# Patient Record
Sex: Female | Born: 1980 | Race: White | Hispanic: No | Marital: Married | Smoking: Never smoker
Health system: Southern US, Community
[De-identification: ages and names within clinical notes are randomized; demographics above are authoritative.]

## PROBLEM LIST (undated history)

## (undated) DIAGNOSIS — R87629 Unspecified abnormal cytological findings in specimens from vagina: Secondary | ICD-10-CM

## (undated) DIAGNOSIS — L723 Sebaceous cyst: Secondary | ICD-10-CM

## (undated) DIAGNOSIS — L732 Hidradenitis suppurativa: Secondary | ICD-10-CM

## (undated) DIAGNOSIS — R87610 Atypical squamous cells of undetermined significance on cytologic smear of cervix (ASC-US): Secondary | ICD-10-CM

## (undated) DIAGNOSIS — IMO0002 Reserved for concepts with insufficient information to code with codable children: Secondary | ICD-10-CM

## (undated) DIAGNOSIS — J302 Other seasonal allergic rhinitis: Secondary | ICD-10-CM

## (undated) DIAGNOSIS — M1990 Unspecified osteoarthritis, unspecified site: Secondary | ICD-10-CM

## (undated) DIAGNOSIS — Z8619 Personal history of other infectious and parasitic diseases: Secondary | ICD-10-CM

## (undated) DIAGNOSIS — F419 Anxiety disorder, unspecified: Secondary | ICD-10-CM

## (undated) DIAGNOSIS — E785 Hyperlipidemia, unspecified: Secondary | ICD-10-CM

## (undated) DIAGNOSIS — D649 Anemia, unspecified: Secondary | ICD-10-CM

## (undated) HISTORY — DX: Reserved for concepts with insufficient information to code with codable children: IMO0002

## (undated) HISTORY — PX: CHOLECYSTECTOMY: SHX55

## (undated) HISTORY — DX: Anemia, unspecified: D64.9

## (undated) HISTORY — DX: Other seasonal allergic rhinitis: J30.2

## (undated) HISTORY — DX: Sebaceous cyst: L72.3

## (undated) HISTORY — DX: Hyperlipidemia, unspecified: E78.5

## (undated) HISTORY — DX: Anxiety disorder, unspecified: F41.9

## (undated) HISTORY — DX: Unspecified osteoarthritis, unspecified site: M19.90

## (undated) HISTORY — DX: Hidradenitis suppurativa: L73.2

## (undated) HISTORY — DX: Personal history of other infectious and parasitic diseases: Z86.19

## (undated) HISTORY — PX: GASTRIC BYPASS: SHX52

## (undated) HISTORY — DX: Unspecified abnormal cytological findings in specimens from vagina: R87.629

---
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
## 2008-02-25 ENCOUNTER — Other Ambulatory Visit: Admission: RE | Admit: 2008-02-25 | Discharge: 2008-02-25 | Payer: Self-pay | Admitting: Obstetrics and Gynecology

## 2012-05-07 ENCOUNTER — Other Ambulatory Visit (HOSPITAL_COMMUNITY)
Admission: RE | Admit: 2012-05-07 | Discharge: 2012-05-07 | Disposition: A | Discharge status: Home / Self Care | Payer: 59 | Source: Ambulatory Visit | Attending: Obstetrics and Gynecology | Admitting: Obstetrics and Gynecology

## 2012-05-07 ENCOUNTER — Other Ambulatory Visit: Payer: Self-pay | Admitting: Adult Health

## 2012-05-07 DIAGNOSIS — Z01419 Encounter for gynecological examination (general) (routine) without abnormal findings: Secondary | ICD-10-CM | POA: Insufficient documentation

## 2012-05-07 DIAGNOSIS — Z1151 Encounter for screening for human papillomavirus (HPV): Secondary | ICD-10-CM | POA: Insufficient documentation

## 2012-05-07 DIAGNOSIS — R87810 Cervical high risk human papillomavirus (HPV) DNA test positive: Secondary | ICD-10-CM | POA: Insufficient documentation

## 2013-04-02 ENCOUNTER — Encounter: Payer: Self-pay | Admitting: Adult Health

## 2013-04-02 ENCOUNTER — Encounter: Payer: 59 | Admitting: Adult Health

## 2013-04-02 NOTE — Progress Notes (Signed)
This encounter was created in error - please disregard.

## 2013-05-13 ENCOUNTER — Encounter: Payer: Self-pay | Admitting: Adult Health

## 2013-05-13 ENCOUNTER — Other Ambulatory Visit (HOSPITAL_COMMUNITY)
Admission: RE | Admit: 2013-05-13 | Discharge: 2013-05-13 | Disposition: A | Discharge status: Home / Self Care | Payer: 59 | Source: Ambulatory Visit | Attending: Adult Health | Admitting: Adult Health

## 2013-05-13 ENCOUNTER — Ambulatory Visit (INDEPENDENT_AMBULATORY_CARE_PROVIDER_SITE_OTHER): Payer: 59 | Admitting: Adult Health

## 2013-05-13 ENCOUNTER — Encounter (INDEPENDENT_AMBULATORY_CARE_PROVIDER_SITE_OTHER): Payer: Self-pay

## 2013-05-13 VITALS — BP 110/80 | HR 74 | Ht 62.5 in | Wt 254.5 lb

## 2013-05-13 DIAGNOSIS — Z01419 Encounter for gynecological examination (general) (routine) without abnormal findings: Secondary | ICD-10-CM | POA: Insufficient documentation

## 2013-05-13 DIAGNOSIS — R05 Cough: Secondary | ICD-10-CM

## 2013-05-13 DIAGNOSIS — R87810 Cervical high risk human papillomavirus (HPV) DNA test positive: Secondary | ICD-10-CM | POA: Insufficient documentation

## 2013-05-13 DIAGNOSIS — Z113 Encounter for screening for infections with a predominantly sexual mode of transmission: Secondary | ICD-10-CM

## 2013-05-13 DIAGNOSIS — L732 Hidradenitis suppurativa: Secondary | ICD-10-CM

## 2013-05-13 DIAGNOSIS — Z1151 Encounter for screening for human papillomavirus (HPV): Secondary | ICD-10-CM | POA: Insufficient documentation

## 2013-05-13 HISTORY — DX: Hidradenitis suppurativa: L73.2

## 2013-05-13 MED ORDER — FLUCONAZOLE 150 MG PO TABS: ORAL_TABLET | ORAL | Status: DC

## 2013-05-13 MED ORDER — SULFAMETHOXAZOLE-TMP DS 800-160 MG PO TABS: 1.0000 | ORAL_TABLET | Freq: Two times a day (BID) | ORAL | Status: DC

## 2013-05-13 NOTE — Patient Instructions (Signed)
Hidradenitis Suppurativa, Sweat Gland Abscess Hidradenitis suppurativa is a long lasting (chronic), uncommon disease of the sweat glands. With this, boil-like lumps and scarring develop in the groin, some times under the arms (axillae), and under the breasts. It may also uncommonly occur behind the ears, in the crease of the buttocks, and around the genitals.  CAUSES  The cause is from a blocking of the sweat glands. They then become infected. It may cause drainage and odor. It is not contagious. So it cannot be given to someone else. It most often shows up in puberty (about 63 to 33 years of age). But it may happen much later. It is similar to acne which is a disease of the sweat glands. This condition is slightly more common in African-Americans and women. SYMPTOMS   Hidradenitis usually starts as one or more red, tender, swellings in the groin or under the arms (axilla).  Over a period of hours to days the lesions get larger. They often open to the skin surface, draining clear to yellow-colored fluid.  The infected area heals with scarring. DIAGNOSIS  Your caregiver makes this diagnosis by looking at you. Sometimes cultures (growing germs on plates in the lab) may be taken. This is to see what germ (bacterium) is causing the infection.  TREATMENT   Topical germ killing medicine applied to the skin (antibiotics) are the treatment of choice. Antibiotics taken by mouth (systemic) are sometimes needed when the condition is getting worse or is severe.  Avoid tight-fitting clothing which traps moisture in.  Dirt does not cause hidradenitis and it is not caused by poor hygiene.  Involved areas should be cleaned daily using an antibacterial soap. Some patients find that the liquid form of Lever 2000, applied to the involved areas as a lotion after bathing, can help reduce the odor related to this condition.  Sometimes surgery is needed to drain infected areas or remove scarred tissue. Removal of  large amounts of tissue is used only in severe cases.  Birth control pills may be helpful.  Oral retinoids (vitamin A derivatives) for 6 to 12 months which are effective for acne may also help this condition.  Weight loss will improve but not cure hidradenitis. It is made worse by being overweight. But the condition is not caused by being overweight.  This condition is more common in people who have had acne.  It may become worse under stress. There is no medical cure for hidradenitis. It can be controlled, but not cured. The condition usually continues for years with periods of getting worse and getting better (remission). Document Released: 01/18/2004 Document Revised: 08/28/2011 Document Reviewed: 02/03/2008 [facility] Patient Information 2014 [place], [place]. Try delsym Physical in 1 year Mammogram at 39  Take septra ds NO shower gel ,use different razors for different

## 2013-05-13 NOTE — Progress Notes (Signed)
Patient ID: Jade Cook, female   DOB: Jan 10, 1981, 32 y.o.   MRN: [id number] History of Present Illness: Jade Cook is a 32 year old white female married in for a pap and physical.She had a normal pap last year but was +HPV.She is complaining of a cough, took Z-pack last week.Also complains of a boil in vagina area.Has gained 30 lbs this year, had labs at PCP, but has not got results yet.   Current Medications, Allergies, Past Medical History, Past Surgical History, Family History and Social History were reviewed in [employer] record.  No joint pain or mood swings.   Review of Systems: Patient denies any headaches, blurred vision, shortness of breath, chest pain, abdominal pain, problems with bowel movements, urination, or intercourse.    Physical Exam:BP 110/80  Pulse 74  Ht 5' 2.5" (1.588 m)  Wt 254 lb 8 oz (115.44 kg)  BMI 45.78 kg/m2  LMP 04/18/2013 General:  Well developed, well nourished, no acute distress Skin:  Warm and dry Neck:  Midline trachea, normal thyroid Lungs; Clear to auscultation bilaterally Breast:  No dominant palpable mass, retraction, or nipple discharge Cardiovascular: Regular rate and rhythm Abdomen:  Soft, non tender, no hepatosplenomegaly Pelvic:  External genitalia is normal in appearance, except she does have some hidradenitis in groin area..  The vagina is normal in appearance, except has boil left labia and she has squeezed it and it is bruised..The cervix is smooth.Pap with HPV performed, and obtained a GC/CHL.  Uterus is felt to be normal size, shape, and contour.  No adnexal masses or tenderness noted. Extremities:  No swelling or varicosities noted Psych:  No mood changes,alert and cooperative,seems happy   Impression: Yearly gyn exam Hidradenitis Cough STD screening    Plan: Physical in 1 year Mammogram at 39 Rx septra ds #28 1 bid x 14 days with 1 refill Rx diflucan 150 mg # 2 1 now and 1 in 3 days with 1  refill Try Zeasorb powders Use different razors to shave with, do not use shower gel Try delsym Follow up GC/CHL in am

## 2013-05-14 LAB — GC/CHLAMYDIA PROBE AMP
CT Probe RNA: NEGATIVE
GC Probe RNA: NEGATIVE

## 2013-05-19 ENCOUNTER — Telehealth: Payer: Self-pay | Admitting: Adult Health

## 2013-05-19 NOTE — Telephone Encounter (Signed)
Pt informed of Negative GC/CHL from 05/13/2013. Pap still pending.

## 2013-05-21 ENCOUNTER — Telehealth: Payer: Self-pay | Admitting: Adult Health

## 2013-05-21 NOTE — Telephone Encounter (Signed)
Left message that pap had +HPV need to repeat next year and to call me with any questions

## 2013-10-03 ENCOUNTER — Other Ambulatory Visit: Payer: Self-pay | Admitting: Adult Health

## 2014-05-19 ENCOUNTER — Other Ambulatory Visit (HOSPITAL_COMMUNITY)
Admission: RE | Admit: 2014-05-19 | Discharge: 2014-05-19 | Disposition: A | Discharge status: Home / Self Care | Payer: 59 | Source: Ambulatory Visit | Attending: Adult Health | Admitting: Adult Health

## 2014-05-19 ENCOUNTER — Ambulatory Visit (INDEPENDENT_AMBULATORY_CARE_PROVIDER_SITE_OTHER): Payer: 59 | Admitting: Adult Health

## 2014-05-19 ENCOUNTER — Encounter: Payer: Self-pay | Admitting: Adult Health

## 2014-05-19 VITALS — BP 138/90 | HR 78 | Ht 63.0 in | Wt 262.5 lb

## 2014-05-19 DIAGNOSIS — Z8619 Personal history of other infectious and parasitic diseases: Secondary | ICD-10-CM

## 2014-05-19 DIAGNOSIS — R87810 Cervical high risk human papillomavirus (HPV) DNA test positive: Secondary | ICD-10-CM | POA: Diagnosis present

## 2014-05-19 DIAGNOSIS — Z1151 Encounter for screening for human papillomavirus (HPV): Secondary | ICD-10-CM | POA: Insufficient documentation

## 2014-05-19 DIAGNOSIS — Z01419 Encounter for gynecological examination (general) (routine) without abnormal findings: Secondary | ICD-10-CM | POA: Insufficient documentation

## 2014-05-19 DIAGNOSIS — IMO0002 Reserved for concepts with insufficient information to code with codable children: Secondary | ICD-10-CM | POA: Insufficient documentation

## 2014-05-19 HISTORY — DX: Personal history of other infectious and parasitic diseases: Z86.19

## 2014-05-19 HISTORY — DX: Reserved for concepts with insufficient information to code with codable children: IMO0002

## 2014-05-19 NOTE — Patient Instructions (Signed)
Physical in 1 year Mammogram at 39  Labs PCP

## 2014-05-19 NOTE — Progress Notes (Signed)
Patient ID: Jade BillingsKimberly D Cook, female   DOB: 14-Jan-1981, 33 y.o.   MRN: [id number] History of Present Illness: Jade BradfordKimberly is a 33 year old white female, married in for pap and physical.She had a normal pap but +HPV 05/13/13.Period cycle varies.She gets bumps in groin area at times.   Current Medications, Allergies, Past Medical History, Past Surgical History, Family History and Social History were reviewed in [facility] electronic medical record.     Review of Systems: Patient denies any headaches, blurred vision, shortness of breath, chest pain, abdominal pain, problems with bowel movements, urination, or intercourse. No mood swings, has pain in arms and thighs numb and right one burns at times and back ache, will see PA next week, she thinks she has PCS.    Physical Exam: General:  Well developed, well nourished, no acute distress Skin:  Warm and dry Neck:  Midline trachea, normal thyroid Lungs; Clear to auscultation bilaterally Breast:  No dominant palpable mass, retraction, or nipple discharge Cardiovascular: Regular rate and rhythm Abdomen:  Soft, non tender, no hepatosplenomegaly Pelvic:  External genitalia is normal in appearance.  The vagina is normal in appearance.  The cervix is smooth.Pap with HPV performed.  Uterus is felt to be normal size, shape, and contour.  No  adnexal masses or tenderness noted. Extremities:  No swelling or varicosities noted Psych:  No mood changes,alert and cooperative,seems happy Explained that period cycle normally varies 21-35 days.  Impression: Well woman gyn exam with pap History of +HPV on pap    Plan: Physical in 1 year Mammogram at 6540  Labs with PCP

## 2014-05-20 LAB — CYTOLOGY - PAP

## 2014-05-22 ENCOUNTER — Telehealth: Payer: Self-pay | Admitting: Adult Health

## 2014-05-22 NOTE — Telephone Encounter (Signed)
Left message to call me Monday  About pap

## 2014-05-25 ENCOUNTER — Telehealth: Payer: Self-pay | Admitting: Adult Health

## 2014-05-25 NOTE — Telephone Encounter (Signed)
Pt has spoke with JAG about results. Encounter closed. JSY

## 2014-05-25 NOTE — Telephone Encounter (Signed)
Pt aware pap was negative for malignancy but had +HPV, will repeat pap in 1 year

## 2014-12-17 ENCOUNTER — Other Ambulatory Visit: Payer: Self-pay | Admitting: Adult Health

## 2014-12-18 ENCOUNTER — Telehealth: Payer: Self-pay | Admitting: Adult Health

## 2014-12-18 NOTE — Telephone Encounter (Signed)
Spoke with pt. Pt states she has a yeast infection. Just got back from vacation. She is requesting Diflucan. I spoke with Dr. Emelda FearFerguson and he ordered Diflucan 150 mg #2 take 1 now and repeat in 3 days if needed 1 refill. Called into [facility] in [place], [place]. JSY

## 2015-05-24 ENCOUNTER — Other Ambulatory Visit: Payer: Self-pay | Admitting: Adult Health

## 2015-06-03 ENCOUNTER — Encounter: Payer: Self-pay | Admitting: Adult Health

## 2015-06-03 ENCOUNTER — Ambulatory Visit (INDEPENDENT_AMBULATORY_CARE_PROVIDER_SITE_OTHER): Payer: BLUE CROSS/BLUE SHIELD | Admitting: Adult Health

## 2015-06-03 ENCOUNTER — Other Ambulatory Visit (HOSPITAL_COMMUNITY)
Admission: RE | Admit: 2015-06-03 | Discharge: 2015-06-03 | Disposition: A | Discharge status: Home / Self Care | Payer: BLUE CROSS/BLUE SHIELD | Source: Ambulatory Visit | Attending: Adult Health | Admitting: Adult Health

## 2015-06-03 VITALS — BP 102/62 | HR 96 | Ht 63.0 in | Wt 271.0 lb

## 2015-06-03 DIAGNOSIS — Z1151 Encounter for screening for human papillomavirus (HPV): Secondary | ICD-10-CM | POA: Diagnosis not present

## 2015-06-03 DIAGNOSIS — Z8619 Personal history of other infectious and parasitic diseases: Secondary | ICD-10-CM

## 2015-06-03 DIAGNOSIS — Z01419 Encounter for gynecological examination (general) (routine) without abnormal findings: Secondary | ICD-10-CM | POA: Diagnosis not present

## 2015-06-03 NOTE — Progress Notes (Signed)
Patient ID: Jade Cook, female   DOB: 1981/03/21, 34 y.o.   MRN: [id number] History of Present Illness:  Jade BradfordKimberly is a 34 year old white female, G0P0, in for well woman gyn exam and pap, she had a normal pap with +HPV 05/19/14.  [facility]P is [facility] in [place], [place].  Current Medications, Allergies, Past Medical History, Past Surgical History, Family History and Social History were reviewed in [facility] electronic medical record.     Review of Systems: Patient denies any headaches, hearing loss, fatigue, blurred vision, shortness of breath, chest pain, abdominal pain, problems with bowel movements, urination, or intercourse. No joint pain, she is moody but that is normal, she says.    Physical Exam:BP 102/62 mmHg  Pulse 96  Ht 5\' 3"  (1.6 m)  Wt 271 lb (122.925 kg)  BMI 48.02 kg/m2  LMP 05/22/2015 General:  Well developed, well nourished, no acute distress Skin:  Warm and dry Neck:  Midline trachea, normal thyroid, good ROM, no lymphadenopathy Lungs; Clear to auscultation bilaterally Breast:  No dominant palpable mass, retraction, or nipple discharge Cardiovascular: Regular rate and rhythm Abdomen:  Soft, non tender, no hepatosplenomegaly.obese Pelvic:  External genitalia is normal in appearance, no lesions.  The vagina is normal in appearance. Urethra has no lesions or masses. The cervix is smooth, pap with HPV performed.  Uterus is felt to be normal size, shape, and contour.  No adnexal masses or tenderness noted.Bladder is non tender, no masses felt. Extremities/musculoskeletal:  No swelling or varicosities noted, no clubbing or cyanosis Psych:  No mood changes, alert and cooperative,seems happy She declines the flu shot.  Impression: Well woman gyn exam with pap History of HPV     Plan: Physical in 1 year Mammogram at 1640  Labs with PCP

## 2015-06-03 NOTE — Patient Instructions (Signed)
Physical in 1 year Mammogram at 39  Labs with PCP 

## 2015-06-07 LAB — CYTOLOGY - PAP

## 2016-05-03 ENCOUNTER — Other Ambulatory Visit: Payer: Self-pay | Admitting: Adult Health

## 2016-06-05 ENCOUNTER — Ambulatory Visit (INDEPENDENT_AMBULATORY_CARE_PROVIDER_SITE_OTHER): Payer: BLUE CROSS/BLUE SHIELD | Admitting: Adult Health

## 2016-06-05 ENCOUNTER — Encounter: Payer: Self-pay | Admitting: Adult Health

## 2016-06-05 VITALS — BP 110/60 | HR 62 | Ht 62.5 in | Wt 200.5 lb

## 2016-06-05 DIAGNOSIS — Z01419 Encounter for gynecological examination (general) (routine) without abnormal findings: Secondary | ICD-10-CM | POA: Insufficient documentation

## 2016-06-05 DIAGNOSIS — L723 Sebaceous cyst: Secondary | ICD-10-CM

## 2016-06-05 DIAGNOSIS — Z01411 Encounter for gynecological examination (general) (routine) with abnormal findings: Secondary | ICD-10-CM

## 2016-06-05 HISTORY — DX: Sebaceous cyst: L72.3

## 2016-06-05 NOTE — Patient Instructions (Addendum)
Physical in 1 year Pap 2019 Mammogram at 39  Labs with PCP

## 2016-06-05 NOTE — Progress Notes (Signed)
Patient ID: Jade Cook, female   DOB: [birth year], 35 y.o.   MRN: [id number] History of Present Illness:  Jade BradfordKimberly is a 35 year old white female, married in for well woman gyn exam, had normal pap with negative HPV 06/03/15.She had gastric [facility]/28/[facility] by Dr Artist PaisYoo, and has lost about 70 lbs.  [place]P [place] in [place].  Current Medications, Allergies, Past Medical History, Past Surgical History, Family History and Social History were reviewed in [facility] electronic medical record.     Review of Systems: Patient denies any headaches, hearing loss, fatigue, blurred vision, shortness of breath, chest pain, abdominal pain, problems with bowel movements, urination, or intercourse(hurts at times since has lost weight). No joint pain or mood swings.    Physical Exam:BP 110/60 (BP Location: Left Arm, Patient Position: Sitting, Cuff Size: Large)   Pulse 62   Ht 5' 2.5" (1.588 m)   Wt 200 lb 8 oz (90.9 kg)   LMP 05/24/2016 (Exact Date)   BMI 36.09 kg/m  General:  Well developed, well nourished, no acute distress Skin:  Warm and dry, has sebaceous cyst in head(Dr Beavers to remove tomorrow) Neck:  Midline trachea, normal thyroid, good ROM, no lymphadenopathy Lungs; Clear to auscultation bilaterally Breast:  No dominant palpable mass, retraction, or nipple discharge Cardiovascular: Regular rate and rhythm Abdomen:  Soft, non tender, no hepatosplenomegaly Pelvic:  External genitalia is normal in appearance, 1cm sebaceous cyst right labia.  The vagina is normal in appearance. Urethra has no lesions or masses. The cervix is bulbous.  Uterus is felt to be normal size, shape, and contour.  No adnexal masses or tenderness noted.Bladder is non tender, no masses felt Extremities/musculoskeletal:  No swelling or varicosities noted, no clubbing or cyanosis Psych:  No mood changes, alert and cooperative,seems happy PHQ 2 score 0.   Impression: 1. Well woman exam with routine  gynecological exam   2. Sebaceous cyst       Plan: Physical in 1 year Pap 2019 Mammogram at 39  Labs with PCP

## 2017-06-07 ENCOUNTER — Encounter: Payer: Self-pay | Admitting: Adult Health

## 2017-06-07 ENCOUNTER — Ambulatory Visit (INDEPENDENT_AMBULATORY_CARE_PROVIDER_SITE_OTHER): Payer: BLUE CROSS/BLUE SHIELD | Admitting: Adult Health

## 2017-06-07 VITALS — BP 108/62 | HR 81 | Ht 62.0 in | Wt 147.5 lb

## 2017-06-07 DIAGNOSIS — L723 Sebaceous cyst: Secondary | ICD-10-CM | POA: Diagnosis not present

## 2017-06-07 DIAGNOSIS — Z01411 Encounter for gynecological examination (general) (routine) with abnormal findings: Secondary | ICD-10-CM

## 2017-06-07 DIAGNOSIS — Z9884 Bariatric surgery status: Secondary | ICD-10-CM

## 2017-06-07 DIAGNOSIS — Z01419 Encounter for gynecological examination (general) (routine) without abnormal findings: Secondary | ICD-10-CM

## 2017-06-07 DIAGNOSIS — N920 Excessive and frequent menstruation with regular cycle: Secondary | ICD-10-CM | POA: Diagnosis not present

## 2017-06-07 NOTE — Progress Notes (Signed)
Patient ID: Jade BillingsKimberly D Cook, female   DOB: [birth year], 36 y.o.   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 Has been tired and wants to sleep.   PCP is Dr Michel SanteeFavero in [place], and she had labs at his office including iron which was normal.   Current Medications, Allergies, Past Medical History, Past Surgical History, Family History and Social History were reviewed in [facility] electronic medical record.     Review of Systems: Patient denies any headaches, hearing loss,  blurred vision, shortness of breath, chest pain, abdominal pain, problems with bowel movements, urination, or intercourse. No joint pain or mood swings. See HPI for positives.   Physical Exam:BP 108/62 (BP Location: Left Arm, Patient Position: Sitting, Cuff Size: Normal)   Pulse 81   Ht 5\' 2"  (1.575 m)   Wt 147 lb 8 oz (66.9 kg)   LMP 05/25/2017   BMI 26.98 kg/m Has lost 102 lbs since surgery, total of 123. General:  Well developed, well nourished, no acute distress Skin:  Warm and dry Neck:  Midline trachea, normal thyroid, good ROM, no lymphadenopathy Lungs; Clear to auscultation bilaterally Breast:  No dominant palpable mass, retraction, or nipple discharge Cardiovascular: Regular rate and rhythm Abdomen:  Soft, non tender, no hepatosplenomegaly Pelvic:  External genitalia is normal in appearance, no lesions.  The vagina is normal in appearance. Urethra has no lesions or masses. The cervix is smooth.  Uterus is felt to be normal size, shape, and contour.  No adnexal masses or tenderness noted.Bladder is non tender, no masses felt. Extremities/musculoskeletal:  No swelling or varicosities noted, no  clubbing or cyanosis Psych:  No mood changes, alert and cooperative,seems happy PHQ 2 score 1.has been to counseling since weight loss surgery.  Impression:  1. Well woman exam with routine gynecological exam   2. Menorrhagia with regular cycle   3. Sebaceous cyst   4. History of weight loss surgery      Plan: Return in 2 weeks for GYN US Pap and physical in 1 year Mammogram at 39

## 2017-06-07 NOTE — Patient Instructions (Signed)

## 2017-06-25 ENCOUNTER — Other Ambulatory Visit: Payer: Self-pay | Admitting: Adult Health

## 2017-06-25 DIAGNOSIS — N920 Excessive and frequent menstruation with regular cycle: Secondary | ICD-10-CM

## 2017-06-26 ENCOUNTER — Ambulatory Visit: Payer: BLUE CROSS/BLUE SHIELD

## 2017-07-05 ENCOUNTER — Ambulatory Visit (INDEPENDENT_AMBULATORY_CARE_PROVIDER_SITE_OTHER): Payer: BLUE CROSS/BLUE SHIELD

## 2017-07-05 DIAGNOSIS — N920 Excessive and frequent menstruation with regular cycle: Secondary | ICD-10-CM

## 2017-07-05 NOTE — Progress Notes (Signed)
PELVIC US TA/TV: anteverted heterogeneous uterus w/ posterior myometrial linear striation, ? Adenomyosis,EEC 7 mm,normal ovaries bilat,no free fluid,normal ovaries bilat,ovaries appear mobile,no pain during ultrasound

## 2017-07-06 ENCOUNTER — Telehealth: Payer: Self-pay | Admitting: Adult Health

## 2017-07-06 DIAGNOSIS — N920 Excessive and frequent menstruation with regular cycle: Secondary | ICD-10-CM

## 2017-07-06 MED ORDER — LEVONORGEST-ETH ESTRAD 91-DAY 0.15-0.03 &0.01 MG PO TABS: 1.0000 | ORAL_TABLET | Freq: Every day | ORAL | 4 refills | Status: DC

## 2017-07-06 NOTE — Telephone Encounter (Signed)
Pt aware US shoed adenomyosis, will rx seasonique as trial

## 2018-09-12 ENCOUNTER — Other Ambulatory Visit: Payer: BLUE CROSS/BLUE SHIELD | Admitting: Adult Health

## 2018-10-01 ENCOUNTER — Other Ambulatory Visit: Payer: Self-pay | Admitting: Adult Health

## 2018-12-23 ENCOUNTER — Encounter: Payer: Self-pay | Admitting: Adult Health

## 2018-12-23 ENCOUNTER — Ambulatory Visit (INDEPENDENT_AMBULATORY_CARE_PROVIDER_SITE_OTHER): Payer: BC Managed Care – PPO | Admitting: Adult Health

## 2018-12-23 ENCOUNTER — Other Ambulatory Visit (HOSPITAL_COMMUNITY)
Admission: RE | Admit: 2018-12-23 | Discharge: 2018-12-23 | Disposition: A | Discharge status: Home / Self Care | Payer: BC Managed Care – PPO | Source: Ambulatory Visit | Attending: Adult Health | Admitting: Adult Health

## 2018-12-23 ENCOUNTER — Other Ambulatory Visit: Payer: Self-pay

## 2018-12-23 VITALS — BP 106/63 | HR 87 | Ht 62.0 in | Wt 159.5 lb

## 2018-12-23 DIAGNOSIS — N946 Dysmenorrhea, unspecified: Secondary | ICD-10-CM

## 2018-12-23 DIAGNOSIS — Z01419 Encounter for gynecological examination (general) (routine) without abnormal findings: Secondary | ICD-10-CM

## 2018-12-23 DIAGNOSIS — N920 Excessive and frequent menstruation with regular cycle: Secondary | ICD-10-CM

## 2018-12-23 DIAGNOSIS — N941 Unspecified dyspareunia: Secondary | ICD-10-CM

## 2018-12-23 DIAGNOSIS — R6882 Decreased libido: Secondary | ICD-10-CM | POA: Insufficient documentation

## 2018-12-23 MED ORDER — ORILISSA 200 MG PO TABS: 1.0000 | ORAL_TABLET | Freq: Two times a day (BID) | ORAL | 5 refills | Status: DC

## 2018-12-23 NOTE — Progress Notes (Signed)
Patient ID: Jade Cook, female   DOB: [birth year], 38 y.o.   MRN: [id number] History of Present Illness: Jade Cook is a 38 year old white female, married, G0P0, in for a well woman gyn exam and pap.She is on OCs, but still has heavy periods, pelvic pain and pain with periods and pain with sex and decreased libido. 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   Current Medications, Allergies, Past Medical History, Past Surgical History, Family History and Social History were reviewed in [employer] record.     Review of Systems: Patient denies any headaches, hearing loss, fatigue, blurred vision, shortness of breath, chest pain, problems with bowel movements, urination. No joint pain or mood swings. See HPI for positives.   Physical Exam:BP 106/63 (BP Location: Left Arm, Patient Position: Sitting, Cuff Size: Normal)   Pulse 87   Ht 5\' 2"  (1.575 m)   Wt 159 lb 8 oz (72.3 kg)   LMP 12/18/2018 (Exact Date)   BMI 29.17 kg/m  General:  Well developed, well nourished, no acute distress Skin:  Warm and dry,tan Neck:  Midline trachea, normal thyroid, good ROM, no lymphadenopathy Lungs; Clear to auscultation bilaterally Breast:  No dominant palpable mass, retraction, or nipple discharge Cardiovascular: Regular rate and rhythm Abdomen:  Soft, non tender, no hepatosplenomegaly Pelvic:  External genitalia is normal in appearance, no lesions.  The vagina is normal in appearance.+dark blood in vault Urethra has no lesions or masses. The cervix is smooth, pap with HPV performed.  Uterus is felt to be normal size, shape, and contour, and tender.  No adnexal masses.RLQ tenderness noted.Bladder is non tender, no masses felt. Extremities/musculoskeletal:  No swelling or varicosities noted, no clubbing or cyanosis Psych:  No mood changes, alert and cooperative,seems happy PHQ 2 score is 0. Fall risk is low. Examination without chaperone with her permission.   Discussed trying Freida Busman, and  possible side effects, like hot flashes, night sweats and no period, and she would like to try.  Impression: 1. Well woman exam with routine gynecological exam   2. Dysmenorrhea   3. Dyspareunia, female   4. Menorrhagia with regular cycle   5. Decreased libido       Plan: Check CBC,CMP,TSH Will continue OCs til can get orilissa approved Meds ordered this encounter  Medications  . Elagolix Sodium (ORILISSA) 200 MG TABS    Sig: Take 1 tablet by mouth 2 (two) times daily.    Dispense:  60 tablet    Refill:  5    Order Specific Question:   Supervising Provider    Answer:   Tania Ade H [[id number]]  Handout and discount card given  Follow up in 8 weeks  Physical in 1 year Pap in 3 if normal Mammogram at 39

## 2018-12-24 LAB — COMPREHENSIVE METABOLIC PANEL
ALT: 15 IU/L (ref 0–32)
AST: 13 IU/L (ref 0–40)
Albumin/Globulin Ratio: 1.5 (ref 1.2–2.2)
Albumin: 4.1 g/dL (ref 3.8–4.8)
Alkaline Phosphatase: 51 IU/L (ref 39–117)
BUN/Creatinine Ratio: 26 — ABNORMAL HIGH (ref 9–23)
BUN: 20 mg/dL (ref 6–20)
Bilirubin Total: 0.5 mg/dL (ref 0.0–1.2)
CO2: 21 mmol/L (ref 20–29)
Calcium: 9 mg/dL (ref 8.7–10.2)
Chloride: 100 mmol/L (ref 96–106)
Creatinine, Ser: 0.77 mg/dL (ref 0.57–1.00)
GFR calc Af Amer: 113 mL/min/{1.73_m2} (ref >59)
GFR calc non Af Amer: 98 mL/min/{1.73_m2} (ref >59)
Globulin, Total: 2.8 g/dL (ref 1.5–4.5)
Glucose: 94 mg/dL (ref 65–99)
Potassium: 4.8 mmol/L (ref 3.5–5.2)
Sodium: 136 mmol/L (ref 134–144)
Total Protein: 6.9 g/dL (ref 6.0–8.5)

## 2018-12-24 LAB — CBC
Hematocrit: 28.8 % — ABNORMAL LOW (ref 34.0–46.6)
Hemoglobin: 8.6 g/dL — ABNORMAL LOW (ref 11.1–15.9)
MCH: 20.6 pg — ABNORMAL LOW (ref 26.6–33.0)
MCHC: 29.9 g/dL — ABNORMAL LOW (ref 31.5–35.7)
MCV: 69 fL — ABNORMAL LOW (ref 79–97)
Platelets: 588 10*3/uL — ABNORMAL HIGH (ref 150–450)
RBC: 4.18 x10E6/uL (ref 3.77–5.28)
RDW: 15.6 % — ABNORMAL HIGH (ref 11.7–15.4)
WBC: 9.7 10*3/uL (ref 3.4–10.8)

## 2018-12-24 LAB — TSH: TSH: 2.73 u[IU]/mL (ref 0.450–4.500)

## 2018-12-25 ENCOUNTER — Telehealth: Payer: Self-pay | Admitting: Adult Health

## 2018-12-25 NOTE — Telephone Encounter (Signed)
Left message about labs, needs MV with fe, call me if wants to discuss further

## 2018-12-26 ENCOUNTER — Encounter: Payer: Self-pay | Admitting: Adult Health

## 2018-12-26 ENCOUNTER — Telehealth: Payer: Self-pay | Admitting: Adult Health

## 2018-12-26 DIAGNOSIS — R87610 Atypical squamous cells of undetermined significance on cytologic smear of cervix (ASC-US): Secondary | ICD-10-CM

## 2018-12-26 HISTORY — DX: Atypical squamous cells of undetermined significance on cytologic smear of cervix (ASC-US): R87.610

## 2018-12-26 LAB — CYTOLOGY - PAP
Diagnosis: UNDETERMINED — AB
HPV: NOT DETECTED

## 2018-12-26 NOTE — Telephone Encounter (Signed)
Pt aware pap is ASCUS with negative HPV, will just repeat in 1 year

## 2019-02-17 ENCOUNTER — Ambulatory Visit: Payer: BC Managed Care – PPO | Admitting: Adult Health

## 2019-02-17 ENCOUNTER — Ambulatory Visit (INDEPENDENT_AMBULATORY_CARE_PROVIDER_SITE_OTHER): Payer: BC Managed Care – PPO | Admitting: Women's Health

## 2019-02-17 ENCOUNTER — Encounter: Payer: Self-pay | Admitting: Women's Health

## 2019-02-17 ENCOUNTER — Other Ambulatory Visit: Payer: Self-pay

## 2019-02-17 VITALS — BP 108/71 | HR 72 | Ht 62.0 in | Wt 156.0 lb

## 2019-02-17 DIAGNOSIS — N941 Unspecified dyspareunia: Secondary | ICD-10-CM

## 2019-02-17 DIAGNOSIS — N920 Excessive and frequent menstruation with regular cycle: Secondary | ICD-10-CM

## 2019-02-17 DIAGNOSIS — D649 Anemia, unspecified: Secondary | ICD-10-CM

## 2019-02-17 DIAGNOSIS — N946 Dysmenorrhea, unspecified: Secondary | ICD-10-CM | POA: Diagnosis not present

## 2019-02-17 NOTE — Patient Instructions (Signed)
Use lubrication on you and him

## 2019-02-17 NOTE — Progress Notes (Signed)
   GYN VISIT Patient name: Jade Cook MRN [id number]  Date of birth: 02/15/1981 Chief Complaint:   Follow-up Jade Cook)  History of Present Illness:   Jade Cook is a 38 y.o. G0P0000 Caucasian female being seen today for f/u on Orilissa rx'd on 12/23/18 for heavy painful periods on COCs and pain w/ sex.  Pain and heaviness w/ periods has completely resolved. Lots of night sweats. Hgb was 8.8 on 7/6, taking fe daily. Still having pain w/ sex. Uses lubrication on him, but not on herself.   Patient's last menstrual period was 12/20/2018. Last pap 12/23/18. Results were:  ASCUS w/ neg HRHPV Review of Systems:   Pertinent items are noted in HPI Denies fever/chills, dizziness, headaches, visual disturbances, fatigue, shortness of breath, chest pain, abdominal pain, vomiting, abnormal vaginal discharge/itching/odor/irritation, problems with periods, bowel movements, urination, or intercourse unless otherwise stated above.  Pertinent History Reviewed:  Reviewed past medical,surgical, social, obstetrical and family history.  Reviewed problem list, medications and allergies. Physical Assessment:   Vitals:   02/17/19 1034  BP: 108/71  Pulse: 72  Weight: 156 lb (70.8 kg)  Height: 5\' 2"  (1.575 m)  Body mass index is 28.53 kg/m.       Physical Examination:   General appearance: alert, well appearing, and in no distress  Mental status: alert, oriented to person, place, and time  Skin: warm & dry   Cardiovascular: normal heart rate noted  Respiratory: normal respiratory effort, no distress  Abdomen: soft, non-tender   Pelvic: examination not indicated  Extremities: no edema   No results found for this or any previous visit (from the past 24 hour(s)).  Assessment & Plan:  1) Menorrhagia w/ dysmenorrhea> resolved on Orilissa 200mg  BID, understands can only take x 7mths and then will need break  2) Dyspareunia> not improved w/ Jade Cook, to try lubrication on him and herself  3)  Anemia> continue fe daily, check CBC today  Meds: No orders of the defined types were placed in this encounter.   Orders Placed This Encounter  Procedures  . CBC    Return in about 3 months (around 05/19/2019) for F/U w/ JAG.  Jade Cook CNM, [facility] 02/17/2019 11:11 AM

## 2019-02-18 LAB — CBC
Hematocrit: 34.2 % (ref 34.0–46.6)
Hemoglobin: 10.2 g/dL — ABNORMAL LOW (ref 11.1–15.9)
MCH: 21.7 pg — ABNORMAL LOW (ref 26.6–33.0)
MCHC: 29.8 g/dL — ABNORMAL LOW (ref 31.5–35.7)
MCV: 73 fL — ABNORMAL LOW (ref 79–97)
Platelets: 474 10*3/uL — ABNORMAL HIGH (ref 150–450)
RBC: 4.69 x10E6/uL (ref 3.77–5.28)
RDW: 20.4 % — ABNORMAL HIGH (ref 11.7–15.4)
WBC: 6.7 10*3/uL (ref 3.4–10.8)

## 2019-05-19 ENCOUNTER — Ambulatory Visit (INDEPENDENT_AMBULATORY_CARE_PROVIDER_SITE_OTHER): Payer: BC Managed Care – PPO | Admitting: Adult Health

## 2019-05-19 ENCOUNTER — Other Ambulatory Visit: Payer: Self-pay

## 2019-05-19 ENCOUNTER — Encounter: Payer: Self-pay | Admitting: Adult Health

## 2019-05-19 VITALS — BP 93/60 | HR 66 | Ht 62.0 in | Wt 158.0 lb

## 2019-05-19 DIAGNOSIS — N941 Unspecified dyspareunia: Secondary | ICD-10-CM

## 2019-05-19 DIAGNOSIS — D649 Anemia, unspecified: Secondary | ICD-10-CM | POA: Insufficient documentation

## 2019-05-19 DIAGNOSIS — N946 Dysmenorrhea, unspecified: Secondary | ICD-10-CM

## 2019-05-19 LAB — POCT HEMOGLOBIN: Hemoglobin: 10.8 g/dL — AB (ref 11–14.6)

## 2019-05-19 NOTE — Progress Notes (Signed)
  Subjective:     Patient ID: Jade Cook, female   DOB: [birth year], 38 y.o.   MRN: [id number]  HPI Jade Cook is a 38 year old white female, G0P0, back in folow up on taking Orilissa, and has not had a period since 12/18/2018, and no pain, but still has pain with sex at times, and now having some hot flashes, night sweats and body ached. PCP is Jade Rack PA.  Review of Systems No periods since 12/18/2018 No pelvic pain +pain with sex +hot flashes +night sweats  +body aches. Denies shortness of breath and may have occasional dizziness.  Reviewed past medical,surgical, social and family history. Reviewed medications and allergies.     Objective:   Physical Exam BP 93/60 (BP Location: Left Arm, Patient Position: Sitting, Cuff Size: Normal)   Pulse 66   Ht 5\' 2"  (1.575 m)   Wt 158 lb (71.7 kg)   LMP 12/18/2018 (Exact Date)   BMI 28.90 kg/m   HGB 10.8 which is up form 8.6 in July. Skin warm and dry.  Lungs: clear to ausculation bilaterally. Cardiovascular: regular rate and rhythm. Discussed can only take Orilissa 200 mg bid for 6 months, which will be up in January, may try Orilisa 150 mg 1 daily to see if helps at all. She is aware that can get iron transfusion, as she is sp gastric bypass and may not be absorbing iron as well , esp if period starts back.     Assessment:     1. Anemia, unspecified type Continue iron  2. Dysmenorrhea Continue Orilissa 200 mg 1 bid for now  3. Dyspareunia, female Continue Orilissa 200 mg 1 bid for now    Plan:     Follow  Up with me 07/02/2019 May switch to Orilissa 150 mg 1 daily then

## 2019-06-05 ENCOUNTER — Other Ambulatory Visit: Payer: Self-pay | Admitting: Adult Health

## 2019-07-01 ENCOUNTER — Encounter: Payer: Self-pay | Admitting: Adult Health

## 2019-07-01 ENCOUNTER — Ambulatory Visit (INDEPENDENT_AMBULATORY_CARE_PROVIDER_SITE_OTHER): Payer: BC Managed Care – PPO | Admitting: Adult Health

## 2019-07-01 ENCOUNTER — Other Ambulatory Visit: Payer: Self-pay

## 2019-07-01 VITALS — BP 114/72 | HR 73 | Ht 62.0 in | Wt 157.0 lb

## 2019-07-01 DIAGNOSIS — N941 Unspecified dyspareunia: Secondary | ICD-10-CM | POA: Diagnosis not present

## 2019-07-01 DIAGNOSIS — N946 Dysmenorrhea, unspecified: Secondary | ICD-10-CM

## 2019-07-01 DIAGNOSIS — E611 Iron deficiency: Secondary | ICD-10-CM | POA: Diagnosis not present

## 2019-07-01 LAB — POCT HEMOGLOBIN: Hemoglobin: 11.5 g/dL (ref 11–14.6)

## 2019-07-01 MED ORDER — ORILISSA 150 MG PO TABS: 1.0000 | ORAL_TABLET | Freq: Every day | ORAL | 6 refills | Status: DC

## 2019-07-01 NOTE — Progress Notes (Addendum)
  Subjective:     Patient ID: Jade Cook, female   DOB: 06-10-1981, 40 y.o.   MRN: [id number]  HPI Jade Cook is a 39 year old white female, G0P0, back in follow up on taking Orilissa 200 mg 1 bid and is doing good, but has hot flashes and night sweats.No period since July 2020, which is good she says. PCP is Sherren Mocha PA.  Review of Systems +hot flashes +night sweats No pelvic pain Some pain with sex but better No period since 12/2018, which she says is good  Reviewed past medical,surgical, social and family history. Reviewed medications and allergies.     Objective:   Physical Exam BP 114/72 (BP Location: Left Arm, Patient Position: Sitting, Cuff Size: Normal)   Pulse 73   Ht 5\' 2"  (1.575 m)   Wt 157 lb (71.2 kg)   BMI 28.72 kg/m   HGB 11.5, which is better was 10.8 05/19/19 and 8.6 in July 2020. Will finish current Orilissa and begin 150 mg daily and she how she does. 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 Talk only Face time 10 minutes.     Assessment:     .1. Low iron Continue iron  2. Dysmenorrhea Finish current orilissa 200 mg and then will start 150 mg 1 daily   3. Dyspareunia, female Meds ordered this encounter  Medications  . Elagolix Sodium (ORILISSA) 150 MG TABS    Sig: Take 1 capsule by mouth daily.    Dispense:  30 tablet    Refill:  6    Order Specific Question:   Supervising Provider    Answer:   [place] [[id number]]      Plan:     Return in 6 months for pap and physical and ROS

## 2019-10-11 ENCOUNTER — Other Ambulatory Visit: Payer: Self-pay | Admitting: Adult Health

## 2019-12-29 ENCOUNTER — Other Ambulatory Visit: Payer: BC Managed Care – PPO | Admitting: Adult Health

## 2019-12-31 ENCOUNTER — Ambulatory Visit (INDEPENDENT_AMBULATORY_CARE_PROVIDER_SITE_OTHER): Payer: BC Managed Care – PPO | Admitting: Adult Health

## 2019-12-31 ENCOUNTER — Encounter: Payer: Self-pay | Admitting: Adult Health

## 2019-12-31 ENCOUNTER — Other Ambulatory Visit (HOSPITAL_COMMUNITY)
Admission: RE | Admit: 2019-12-31 | Discharge: 2019-12-31 | Disposition: A | Discharge status: Home / Self Care | Payer: BC Managed Care – PPO | Source: Ambulatory Visit | Attending: Adult Health | Admitting: Adult Health

## 2019-12-31 VITALS — BP 111/75 | HR 53 | Ht 62.5 in | Wt 158.0 lb

## 2019-12-31 DIAGNOSIS — Z01419 Encounter for gynecological examination (general) (routine) without abnormal findings: Secondary | ICD-10-CM | POA: Diagnosis not present

## 2019-12-31 DIAGNOSIS — R6882 Decreased libido: Secondary | ICD-10-CM | POA: Diagnosis not present

## 2019-12-31 DIAGNOSIS — R87610 Atypical squamous cells of undetermined significance on cytologic smear of cervix (ASC-US): Secondary | ICD-10-CM | POA: Diagnosis not present

## 2019-12-31 DIAGNOSIS — N941 Unspecified dyspareunia: Secondary | ICD-10-CM

## 2019-12-31 MED ORDER — PREMARIN 0.625 MG/GM VA CREA
TOPICAL_CREAM | VAGINAL | 12 refills | Status: DC
Start: 2019-12-31 — End: 2020-12-23

## 2019-12-31 MED ORDER — ORILISSA 150 MG PO TABS: 1.0000 | ORAL_TABLET | Freq: Every day | ORAL | 6 refills | Status: DC

## 2019-12-31 NOTE — Progress Notes (Signed)
Patient ID: Jade Cook, female   DOB: [birth year], 39 y.o.   MRN: [id number] History of Present Illness: Jade Cook is a 39 year old white female,married, G0P0, in for a well woman gyn exam and pap. She is on 150 mg of orlissa daily, no period since last July til end of June this year, no pelvic pain, but still has pain with sex,esp near introitus now.  She works at [employer], academic.  PCP is Sherren Mocha PA.   Current Medications, Allergies, Past Medical History, Past Surgical History, Family History and Social History were reviewed in [employer] record.     Review of Systems: Patient denies any headaches, hearing loss, fatigue, blurred vision, shortness of breath, chest pain, abdominal pain, problems with bowel movements, or urination. No joint pain or mood swings. Has pain with sex. Had period end of June,first one since last July on orlissa, has no pelvic pain or pain with period now   Physical Exam:BP 111/75 (BP Location: Right Arm, Patient Position: Sitting, Cuff Size: Normal)   Pulse (!) 53   Ht 5' 2.5" (1.588 m)   Wt 158 lb (71.7 kg)   LMP 12/14/2019   BMI 28.44 kg/m  General:  Well developed, well nourished, no acute distress Skin:  Warm and dry Neck:  Midline trachea, normal thyroid, good ROM, no lymphadenopathy Lungs; Clear to auscultation bilaterally Breast:  No dominant palpable mass, retraction, or nipple discharge Cardiovascular: Regular rate and rhythm Abdomen:  Soft, non tender, no hepatosplenomegaly Pelvic:  External genitalia is normal in appearance, no lesions.  The vagina is normal in appearance. Urethra has no lesions or masses. The cervix is smooth, pap with high risk HPV 16/18 genotyping performed.  Uterus is felt to be normal size, shape, and contour.  No adnexal masses or tenderness noted.Bladder is non tender, no masses felt. Extremities/musculoskeletal:  No swelling or varicosities noted, no clubbing or cyanosis Psych:  No mood  changes, alert and cooperative,seems happy AA is 2 Fall risk is low PHQ 9 score is 6, no SI Examination chaperoned by Malachy Mood LPN  Upstream - 12/31/19 0921      Pregnancy Intention Screening   Does the patient want to become pregnant in the next year? No    Does the patient's partner want to become pregnant in the next year? No    Would the patient like to discuss contraceptive options today? No      Contraception Wrap Up   Current Method Vasectomy    End Method Vasectomy    Contraception Counseling Provided No           Impression and Plan:  1. Encounter for gynecological examination with Papanicolaou smear of cervix Pap sent Physical in 1 year Pap in 3 if normal Mammogram at 39 Labs with PCP  2. Dyspareunia, female Will try PVC Will continue Orlissa Meds ordered this encounter  Medications  . Elagolix Sodium (ORILISSA) 150 MG TABS    Sig: Take 1 capsule by mouth daily.    Dispense:  30 tablet    Refill:  6    Order Specific Question:   Supervising Provider    Answer:   Despina Hidden, LUTHER H [[id number]]  . conjugated estrogens (PREMARIN) vaginal cream    Sig: Apply with finger just inside vagina    Dispense:  24 g    Refill:  12    Order Specific Question:   Supervising Provider    Answer:   Duane Lope H [[id number]]  Follow up in 3 months for ROS 3. Decreased libido   4. ASCUS of cervix with negative high risk HPV Pap sent

## 2020-01-02 LAB — CYTOLOGY - PAP
Comment: NEGATIVE
Diagnosis: NEGATIVE
High risk HPV: NEGATIVE

## 2020-01-09 ENCOUNTER — Other Ambulatory Visit: Payer: Self-pay | Admitting: Adult Health

## 2020-04-01 ENCOUNTER — Other Ambulatory Visit: Payer: Self-pay

## 2020-04-01 ENCOUNTER — Ambulatory Visit (INDEPENDENT_AMBULATORY_CARE_PROVIDER_SITE_OTHER): Payer: BC Managed Care – PPO | Admitting: Adult Health

## 2020-04-01 ENCOUNTER — Encounter: Payer: Self-pay | Admitting: Adult Health

## 2020-04-01 VITALS — BP 100/64 | HR 57 | Ht 62.5 in | Wt 159.5 lb

## 2020-04-01 DIAGNOSIS — N941 Unspecified dyspareunia: Secondary | ICD-10-CM

## 2020-04-01 NOTE — Progress Notes (Signed)
  Subjective:     Patient ID: Jade Cook, female   DOB: July 13, 1980, 39 y.o.   MRN: [id number]  HPI Jade Cook is a 39 year old white female,married, G0P0, back in follow up on using PVC, but has used regularly, she lost her job and had COVID in September and had period then, it had been over a year, is on [place]. PCP is Sherren Mocha PA  Review of Systems Sex maybe a little better Reviewed past medical,surgical, social and family history. Reviewed medications and allergies.      Objective:   Physical Exam BP 100/64 (BP Location: Left Arm, Patient Position: Sitting, Cuff Size: Normal)   Pulse (!) 57   Ht 5' 2.5" (1.588 m)   Wt 159 lb 8 oz (72.3 kg)   BMI 28.71 kg/m  Skin warm and dry.  Lungs: clear to ausculation bilaterally. Cardiovascular: regular rate and rhythm. Pelvic exam is deferred today.    Assessment:     1. Dyspareunia, female Continue [name] Try to use PVC more often    Plan:     Follow up in July for physical, or sooner of needed

## 2020-07-29 ENCOUNTER — Other Ambulatory Visit: Payer: Self-pay | Admitting: Adult Health

## 2020-12-23 ENCOUNTER — Other Ambulatory Visit: Payer: Self-pay

## 2020-12-23 ENCOUNTER — Encounter: Payer: Self-pay | Admitting: Adult Health

## 2020-12-23 ENCOUNTER — Ambulatory Visit (INDEPENDENT_AMBULATORY_CARE_PROVIDER_SITE_OTHER): Payer: Commercial Managed Care - PPO | Admitting: Adult Health

## 2020-12-23 VITALS — BP 113/55 | HR 62 | Ht 62.0 in | Wt 161.0 lb

## 2020-12-23 DIAGNOSIS — N921 Excessive and frequent menstruation with irregular cycle: Secondary | ICD-10-CM | POA: Insufficient documentation

## 2020-12-23 DIAGNOSIS — Z1211 Encounter for screening for malignant neoplasm of colon: Secondary | ICD-10-CM

## 2020-12-23 DIAGNOSIS — Z1231 Encounter for screening mammogram for malignant neoplasm of breast: Secondary | ICD-10-CM | POA: Insufficient documentation

## 2020-12-23 DIAGNOSIS — N941 Unspecified dyspareunia: Secondary | ICD-10-CM | POA: Diagnosis not present

## 2020-12-23 DIAGNOSIS — Z01419 Encounter for gynecological examination (general) (routine) without abnormal findings: Secondary | ICD-10-CM | POA: Diagnosis not present

## 2020-12-23 LAB — HEMOCCULT GUIAC POC 1CARD (OFFICE): Fecal Occult Blood, POC: NEGATIVE

## 2020-12-23 MED ORDER — ORILISSA 150 MG PO TABS: 1.0000 | ORAL_TABLET | Freq: Every day | ORAL | 5 refills | Status: DC

## 2020-12-23 NOTE — Progress Notes (Signed)
Patient ID: Jade Cook, female   DOB: 21-Sep-1980, 40 y.o.   MRN: [id number] History of Present Illness: Jade Cook is a 40 year old white female,married, G0P0, in for a well woman gyn exam.  Lab Results  Component Value Date   DIAGPAP  12/31/2019    - Negative for intraepithelial lesion or malignancy (NILM)   HPV NOT DETECTED 12/23/2018   HPVHIGH Negative 12/31/2019   PCP is Sherren Mocha PA.   Current Medications, Allergies, Past Medical History, Past Surgical History, Family History and Social History were reviewed in [employer] record.     Review of Systems: Patient denies any headaches, hearing loss, fatigue, blurred vision, shortness of breath, chest pain, abdominal pain, problems with bowel movements, urination, or intercourse. (Has pain with sex at times, at just one spot when going in). Periods irregular and when when has, is on Orlissa.  No joint pain or mood swings.     Physical Exam:BP (!) 113/55 (BP Location: Left Arm, Patient Position: Sitting, Cuff Size: Normal)   Pulse 62   Ht 5\' 2"  (1.575 m)   Wt 161 lb (73 kg)   LMP 11/01/2020   BMI 29.45 kg/m   General:  Well developed, well nourished, no acute distress Skin:  Warm and dry,tan  Neck:  Midline trachea, normal thyroid, good ROM, no lymphadenopathy Lungs; Clear to auscultation bilaterally Breast:  No dominant palpable mass, retraction, or nipple discharge Cardiovascular: Regular rate and rhythm Abdomen:  Soft, non tender, no hepatosplenomegaly Pelvic:  External genitalia is normal in appearance, no lesions.  The vagina is normal in appearance. Urethra has no lesions or masses. The cervix is smooth.  Uterus is felt to be normal size, shape, and contour.  No adnexal masses or tenderness noted.Bladder is non tender, no masses felt. Rectal: Good sphincter tone, no polyps, or hemorrhoids felt.  Hemoccult negative. Extremities/musculoskeletal:  No swelling or varicosities noted, no clubbing  or cyanosis Psych:  No mood changes, alert and cooperative,seems happy  AA is 1 Fall risk is low Depression screen [facility] 2/9 12/23/2020 12/31/2019 12/23/2018  Decreased Interest 0 1 0  Down, Depressed, Hopeless 0 1 0  PHQ - 2 Score 0 2 0  Altered sleeping 1 2 -  Tired, decreased energy 1 1 -  Change in appetite 1 1 -  Feeling bad or failure about yourself  0 0 -  Trouble concentrating 0 0 -  Moving slowly or fidgety/restless 0 0 -  Suicidal thoughts 0 0 -  PHQ-9 Score 3 6 -  Difficult doing work/chores - Not difficult at all -    GAD 7 : Generalized Anxiety Score 12/23/2020 12/31/2019  Nervous, Anxious, on Edge 1 2  Control/stop worrying 0 1  Worry too much - different things 0 2  Trouble relaxing 0 1  Restless 0 0  Easily annoyed or irritable 1 2  Afraid - awful might happen 0 0  Total GAD 7 Score 2 8  Anxiety Difficulty - Somewhat difficult      Upstream - 12/23/20 02/23/21       Pregnancy Intention Screening   Does the patient want to become pregnant in the next year? No    Does the patient's partner want to become pregnant in the next year? No    Would the patient like to discuss contraceptive options today? No      Contraception Wrap Up   Current Method Vasectomy    End Method Vasectomy    Contraception Counseling  Provided No            Examination chaperoned by Malachy Mood LPN  Impression and Plan: 1. Encounter for well woman exam with routine gynecological exam Physical in 1 year Pap in 2024 Labs with PCP  2. Encounter for screening fecal occult blood testing   3. Screening mammogram for breast cancer Scheduled mammogram for her 01/10/21 at 6:45 pm at [facility]   4. Dyspareunia, female Will refill Orlissa for 6 months will need to stop then for a while,as will have used for 2 years, could try OCs again, will talk then  Meds ordered this encounter  Medications   Elagolix Sodium (ORILISSA) 150 MG TABS    Sig: Take 1 tablet by mouth daily.    Dispense:  28  tablet    Refill:  5    Order Specific Question:   Supervising Provider    Answer:   Despina Hidden, LUTHER H [[id number]]     5. Menorrhagia with irregular cycle

## 2021-01-10 ENCOUNTER — Other Ambulatory Visit: Payer: Self-pay

## 2021-01-10 ENCOUNTER — Ambulatory Visit (HOSPITAL_COMMUNITY)
Admission: RE | Admit: 2021-01-10 | Discharge: 2021-01-10 | Disposition: A | Discharge status: Home / Self Care | Payer: Commercial Managed Care - PPO | Source: Ambulatory Visit | Attending: Adult Health | Admitting: Adult Health

## 2021-01-10 DIAGNOSIS — Z1231 Encounter for screening mammogram for malignant neoplasm of breast: Secondary | ICD-10-CM | POA: Diagnosis not present

## 2021-07-05 ENCOUNTER — Other Ambulatory Visit: Payer: Self-pay | Admitting: Adult Health

## 2021-07-05 ENCOUNTER — Telehealth: Payer: Self-pay | Admitting: Adult Health

## 2021-07-05 MED ORDER — NORETHINDRONE 0.35 MG PO TABS: 1.0000 | ORAL_TABLET | Freq: Every day | ORAL | 11 refills | Status: DC

## 2021-07-05 NOTE — Addendum Note (Signed)
Addended by: Cyril Mourning A on: 07/05/2021 11:00 AM   Modules accepted: Orders

## 2021-07-05 NOTE — Telephone Encounter (Signed)
At last visit patient was told when she took a break on the Orilissa that Victorino Dike would prescribe a birth control pill for her.

## 2021-07-05 NOTE — Telephone Encounter (Signed)
Patient called stating that she was told that Jade Cook would call her in another medication  when she has finished the other. Patient states she uses [facility] in [place] on [address]. Please contact pt

## 2021-07-05 NOTE — Telephone Encounter (Signed)
Will stop orlissa and start Micronor.

## 2022-01-06 ENCOUNTER — Encounter: Payer: Self-pay | Admitting: Adult Health

## 2022-01-06 ENCOUNTER — Ambulatory Visit (INDEPENDENT_AMBULATORY_CARE_PROVIDER_SITE_OTHER): Payer: Commercial Managed Care - PPO | Admitting: Adult Health

## 2022-01-06 VITALS — BP 114/71 | HR 68 | Ht 62.25 in | Wt 174.5 lb

## 2022-01-06 DIAGNOSIS — D500 Iron deficiency anemia secondary to blood loss (chronic): Secondary | ICD-10-CM

## 2022-01-06 DIAGNOSIS — N926 Irregular menstruation, unspecified: Secondary | ICD-10-CM

## 2022-01-06 DIAGNOSIS — R6882 Decreased libido: Secondary | ICD-10-CM

## 2022-01-06 DIAGNOSIS — Z01419 Encounter for gynecological examination (general) (routine) without abnormal findings: Secondary | ICD-10-CM

## 2022-01-06 DIAGNOSIS — R102 Pelvic and perineal pain: Secondary | ICD-10-CM | POA: Insufficient documentation

## 2022-01-06 DIAGNOSIS — R4589 Other symptoms and signs involving emotional state: Secondary | ICD-10-CM | POA: Insufficient documentation

## 2022-01-06 DIAGNOSIS — N921 Excessive and frequent menstruation with irregular cycle: Secondary | ICD-10-CM

## 2022-01-06 DIAGNOSIS — Z1211 Encounter for screening for malignant neoplasm of colon: Secondary | ICD-10-CM

## 2022-01-06 DIAGNOSIS — G479 Sleep disorder, unspecified: Secondary | ICD-10-CM

## 2022-01-06 LAB — HEMOCCULT GUIAC POC 1CARD (OFFICE): Fecal Occult Blood, POC: NEGATIVE

## 2022-01-06 NOTE — Progress Notes (Signed)
Patient ID: Jade Cook, female   DOB: 19-Jun-1981, 41 y.o.   MRN: [id number] History of Present Illness: Jade Cook is a 41 year old white female, married, in for a well woman gyn exam.  She went to see [facility] in April and had labs and was placed on cytomel, Prometrium and testosterone cream. She was having irregular periods and decreased libido and heavy periods at times, and not sleeping well.  She was anemic, all other labs normal.  HGB 9.8 in May. Has pelvic pain. Moody.  She took last Prometrium last night, it did help with sleep some.  Lab Results  Component Value Date   DIAGPAP  12/31/2019    - Negative for intraepithelial lesion or malignancy (NILM)   HPV NOT DETECTED 12/23/2018   HPVHIGH Negative 12/31/2019    PCP is C Eure PA.  Current Medications, Allergies, Past Medical History, Past Surgical History, Family History and Social History were reviewed in [employer] record.     Review of Systems: Patient denies any headaches, hearing loss, fatigue, blurred vision, shortness of breath, chest pain, abdominal pain, problems with bowel movements, urination, or intercourse. No joint pain or mood swings.   See HPI for positives.   Physical Exam:BP 114/71 (BP Location: Left Arm, Patient Position: Sitting, Cuff Size: Normal)   Pulse 68   Ht 5' 2.25" (1.581 m)   Wt 174 lb 8 oz (79.2 kg)   LMP 12/24/2021   BMI 31.66 kg/m   General:  Well developed, well nourished, no acute distress Skin:  Warm and dry,tan Neck:  Midline trachea, normal thyroid, good ROM, no lymphadenopathy Lungs; Clear to auscultation bilaterally Breast:  No dominant palpable mass, retraction, or nipple discharge Cardiovascular: Regular rate and rhythm Abdomen:  Soft, non tender, no hepatosplenomegaly Pelvic:  External genitalia is normal in appearance, no lesions.  The vagina is normal in appearance. Urethra has no lesions or masses. The cervix is bulbous.  Uterus is felt to be normal  size, shape, and contour.  No adnexal masses or tenderness noted.Bladder is non tender, no masses felt. Rectal: Good sphincter tone, no polyps, or hemorrhoids felt.  Hemoccult negative. Extremities/musculoskeletal:  No swelling or varicosities noted, no clubbing or cyanosis Psych:  No mood changes, alert and cooperative,seems happy AA is 2 Fall risk is low    01/06/2022    9:43 AM 12/23/2020    8:54 AM 12/31/2019    9:12 AM  Depression screen PHQ 2/9  Decreased Interest 1 0 1  Down, Depressed, Hopeless 0 0 1  PHQ - 2 Score 1 0 2  Altered sleeping 1 1 2   Tired, decreased energy 2 1 1   Change in appetite 1 1 1   Feeling bad or failure about yourself  0 0 0  Trouble concentrating 0 0 0  Moving slowly or fidgety/restless 1 0 0  Suicidal thoughts 0 0 0  PHQ-9 Score 6 3 6   Difficult doing work/chores   Not difficult at all       01/06/2022    9:43 AM 12/23/2020    8:55 AM 12/31/2019    9:12 AM  GAD 7 : Generalized Anxiety Score  Nervous, Anxious, on Edge 1 1 2   Control/stop worrying 0 0 1  Worry too much - different things 1 0 2  Trouble relaxing 0 0 1  Restless 1 0 0  Easily annoyed or irritable 1 1 2   Afraid - awful might happen 0 0 0  Total GAD 7  Score 4 2 8   Anxiety Difficulty   Somewhat difficult      Upstream - 01/06/22 0950       Pregnancy Intention Screening   Does the patient want to become pregnant in the next year? No    Does the patient's partner want to become pregnant in the next year? No    Would the patient like to discuss contraceptive options today? No      Contraception Wrap Up   Current Method Vasectomy    End Method Vasectomy            Examination chaperoned by 01/08/22 LPN  Impression and Plan: 1. Encounter for well woman exam with routine gynecological exam Pap and physical in 1 year Had mammogram 01/10/21  2. Encounter for screening fecal occult blood testing Hemoccult was negative  - POCT occult blood stool  3. Pelvic pain Will get 01/12/21  01/20/22 at [facility] at 8:30 am to assess uterus and ovaries  - [facility] PELVIC COMPLETE WITH TRANSVAGINAL; Future  4. Irregular periods  5. Moody  6. Decreased libido Has testosterone cream  7. Sleep disturbance  8. Menorrhagia with irregular cycle If [place] normal, may try low dose COCs   9. Iron deficiency anemia due to chronic blood loss Continue iron

## 2022-01-20 ENCOUNTER — Ambulatory Visit (HOSPITAL_COMMUNITY)
Admission: RE | Admit: 2022-01-20 | Discharge: 2022-01-20 | Disposition: A | Discharge status: Home / Self Care | Payer: Commercial Managed Care - PPO | Source: Ambulatory Visit | Attending: Adult Health | Admitting: Adult Health

## 2022-01-20 DIAGNOSIS — R102 Pelvic and perineal pain: Secondary | ICD-10-CM | POA: Diagnosis present

## 2022-01-25 ENCOUNTER — Encounter: Payer: Self-pay | Admitting: Adult Health

## 2022-01-25 ENCOUNTER — Ambulatory Visit (INDEPENDENT_AMBULATORY_CARE_PROVIDER_SITE_OTHER): Payer: Commercial Managed Care - PPO | Admitting: Adult Health

## 2022-01-25 DIAGNOSIS — N921 Excessive and frequent menstruation with irregular cycle: Secondary | ICD-10-CM | POA: Diagnosis not present

## 2022-01-25 MED ORDER — LO LOESTRIN FE 1 MG-10 MCG / 10 MCG PO TABS: 1.0000 | ORAL_TABLET | Freq: Every day | ORAL | 11 refills | Status: DC

## 2022-01-25 NOTE — Progress Notes (Signed)
Patient ID: Jade Cook, female   DOB: [birth year], 41 y.o.   MRN: [id number]   TELEHEALTH GYNECOLOGY VISIT ENCOUNTER NOTE  [facility] location: [facility]   Patient location: Home  I connected with Jon Billings on 01/25/22 at  3:50 PM EDT by telephone and verified that I am speaking with the correct person using two identifiers. Patient was unable to do MyChart audiovisual encounter due to technical difficulties, she tried several times.    I discussed the limitations, risks, security and privacy concerns of performing an evaluation and management service by telephone and the availability of in person appointments. I also discussed with the patient that there may be a patient responsible charge related to this service. The patient expressed understanding and agreed to proceed.   History:  Jade Cook is a 41 y.o. G0P0000 female being evaluated today for having 3 periods in July, and review [place] from 01/20/22.. She denies any abnormal vaginal discharge, pelvic pain or other concerns.       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   Health Maintenance:  Normal pap and negative HRHPV on 12/31/19.  Normal mammogram on 01/10/21  Review of Systems:  Pertinent items noted in HPI and remainder of comprehensive ROS otherwise negative.  Physical Exam:    General:  Alert, oriented and cooperative.   Mental Status: Normal mood and affect perceived. Normal judgment and thought content.  Physical exam deferred due to nature of the encounter LMP 01/07/2022      Upstream - 01/25/22 1639       Pregnancy Intention Screening   Does the patient want to become pregnant in the next year? No    Does the patient's partner want to become pregnant in the next year? No    Would the patient like to discuss contraceptive options today? No      Contraception Wrap Up   Current Method Vasectomy    End Method Vasectomy    Contraception Counseling Provided No             Labs and Imaging No results found for this or any previous visit (from the past 336 hour(s)). US PELVIC COMPLETE WITH TRANSVAGINAL  Result Date: 01/20/2022 CLINICAL DATA:  Pelvic pain for 2 years, LMP 01/14/2022 EXAM: TRANSABDOMINAL AND TRANSVAGINAL ULTRASOUND OF PELVIS TECHNIQUE: Both transabdominal and transvaginal ultrasound examinations of the pelvis were performed. Transabdominal technique was performed for global imaging of the pelvis including uterus, ovaries, adnexal regions, and pelvic cul-de-sac. It was necessary to proceed with endovaginal exam following the transabdominal exam to visualize the endometrium and LEFT ovary. COMPARISON:  07/05/2017 FINDINGS: Uterus Measurements: 6.8 x 5.6 x 5.8 cm = volume: 117 mL. Anteverted. Heterogeneous myometrium. Asymmetric thickening of posterior upper uterine wall, no definite discrete mass identified, favor adenomyosis. No additional masses.  Endometrium Thickness: 1 mm.  No endometrial fluid or mass Right ovary Measurements: 4.7 x 3.3 x 5.0 cm = volume: 40.6 mL. Simple cyst RIGHT ovary 4.4 x 2.9 x 3.8 cm; 3.0 x 1.8 x 1.9 cm note: This recommendation does not apply to premenarchal patients or to those with increased risk (genetic, family history, elevated tumor markers or other high-risk factors) of ovarian cancer. Reference: Radiology 2019  Nov; 293(2):359-371. Additional small follicles. No additional mass. Left ovary Measurements: 3.0 x 1.8 x 1.9 cm = volume: 5.3 mL. Normal morphology without mass Other findings No free pelvic fluid or adnexal masses. IMPRESSION: Simple cyst RIGHT ovary 4.4 cm greatest size; no follow-up imaging recommended, as above. Suspected adenomyosis of the posterior upper uterus. Otherwise negative exam. Electronically Signed   By: Ulyses Southward M.D.   On: 01/20/2022 10:07      Assessment and Plan:     1. Menorrhagia with irregular cycle She denies any history of MI,stroke,DVT,breast cancer or migraine with aura Will try low dose COCs,start with next period  Meds ordered this encounter  Medications   Norethindrone-Ethinyl Estradiol-Fe Biphas (LO LOESTRIN FE) 1 MG-10 MCG / 10 MCG tablet    Sig: Take 1 tablet by mouth daily. Take 1 daily by mouth    Dispense:  28 tablet    Refill:  11    BIN [id number], PCN [id number], GRP [id number]    Order Specific Question:   Supervising Provider    Answer:   Duane Lope H [[id number]]    Follow up in 3 months for ROS      I discussed the assessment and treatment plan with the patient. The patient was provided an opportunity to ask questions and all were answered. The patient agreed with the plan and demonstrated an understanding of the instructions.   The patient was advised to call back or seek an in-person evaluation/go to the ED if the symptoms worsen or if the condition fails to improve as anticipated.  I provided 10 minutes of non-face-to-face time during this encounter. I was in my office at [facility] during this encounter   Cyril Mourning, NP [facility] [employer], [facility]

## 2022-01-26 ENCOUNTER — Ambulatory Visit: Payer: Commercial Managed Care - PPO | Admitting: Adult Health

## 2023-07-22 IMAGING — MG MM DIGITAL SCREENING BILAT W/ TOMO AND CAD
8 series · 9 of 24 positions shown · non-contrast
Comparison: None.

CLINICAL DATA: Screening. Baseline.

EXAM:
DIGITAL SCREENING BILATERAL MAMMOGRAM WITH TOMOSYNTHESIS AND CAD
TECHNIQUE: Bilateral screening digital craniocaudal and mediolateral oblique
mammograms were obtained. Bilateral screening digital breast
tomosynthesis was performed. The images were evaluated with
computer-aided detection.

[R CC synth-2D]
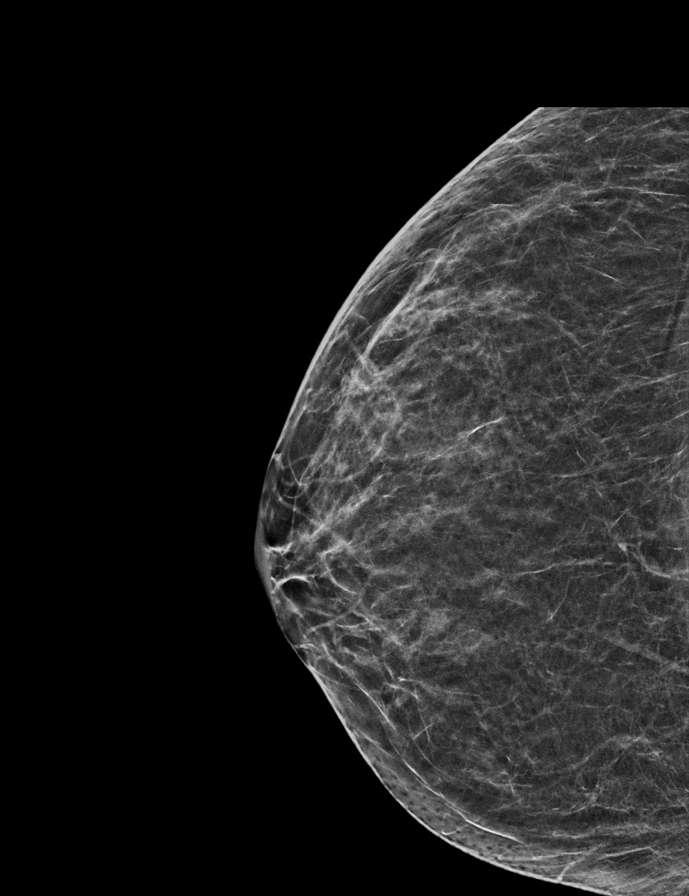

[L CC synth-2D]
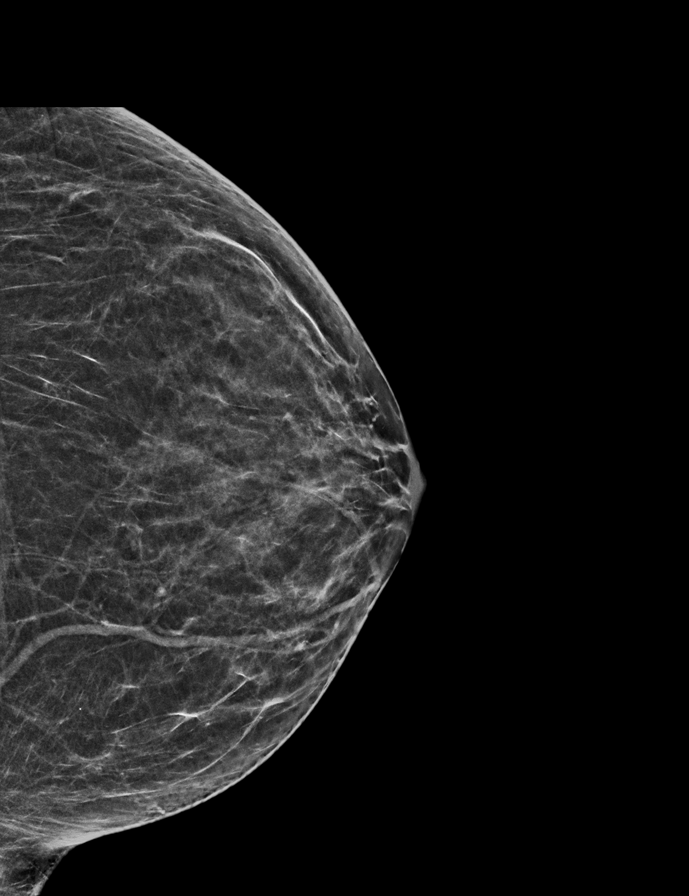

[R MLO synth-2D]
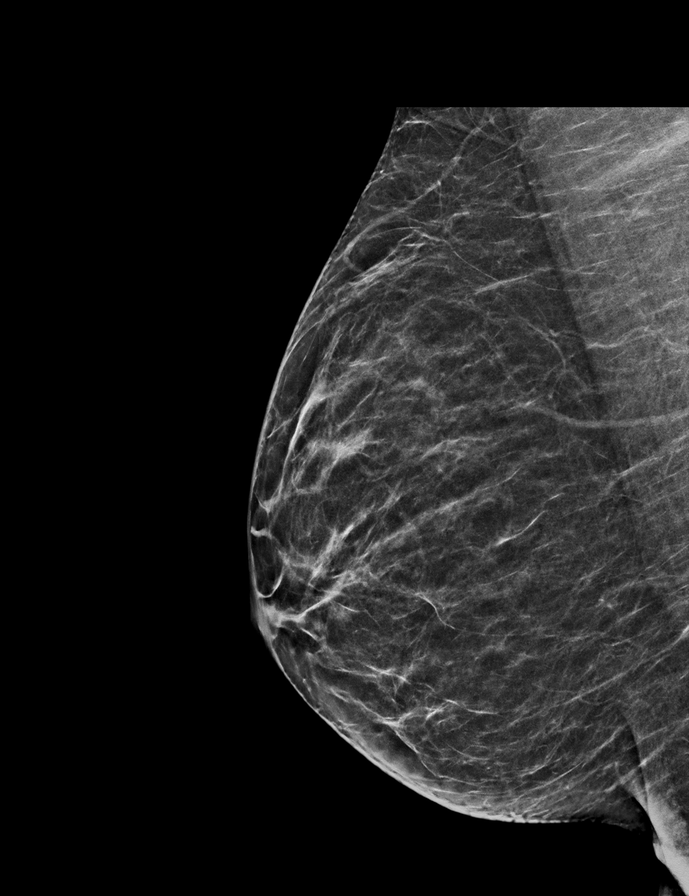

[L MLO synth-2D]
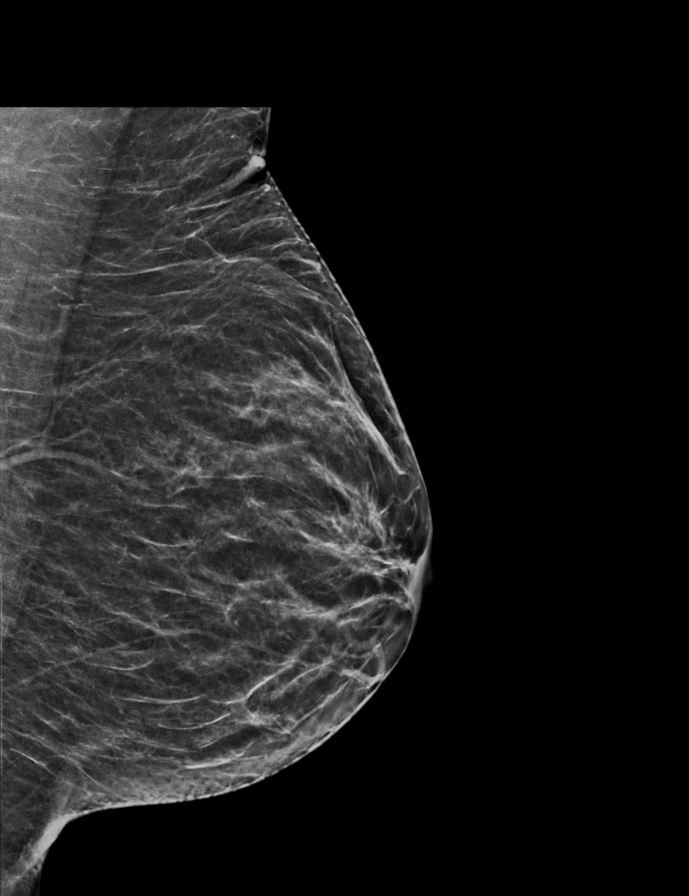

[L MLO tomo · 2 of 51 frames shown]
[frame 17/51]
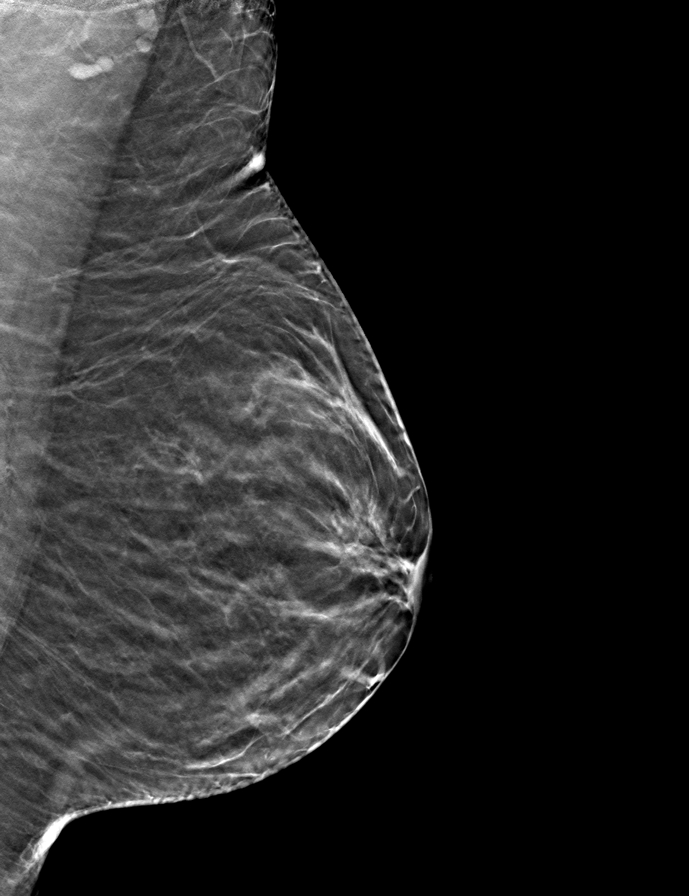
[frame 26/51]
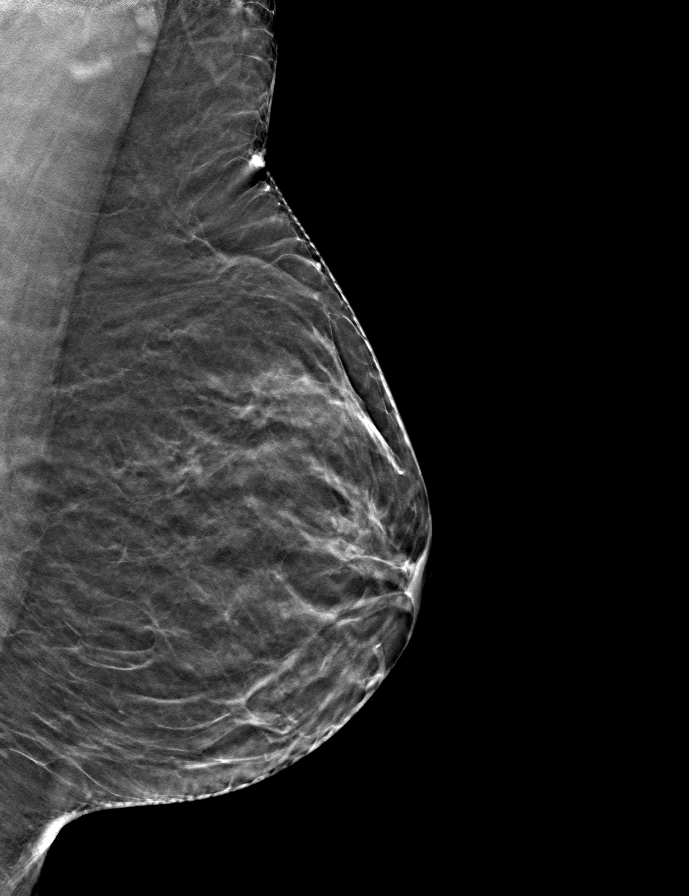

[R MLO tomo · tomo slice 27/53.0]
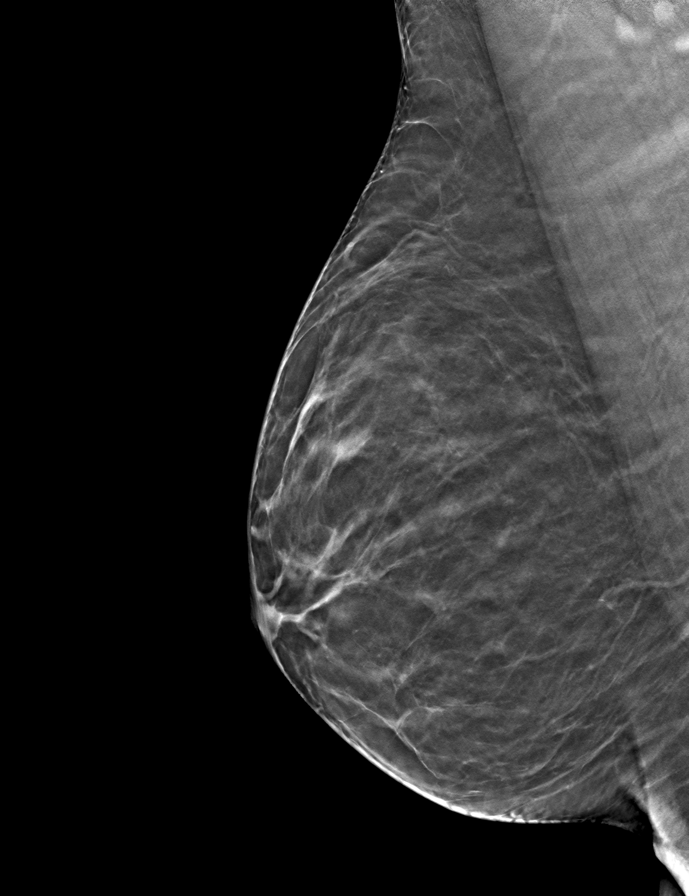

[L CC tomo · tomo slice 25/48.0]
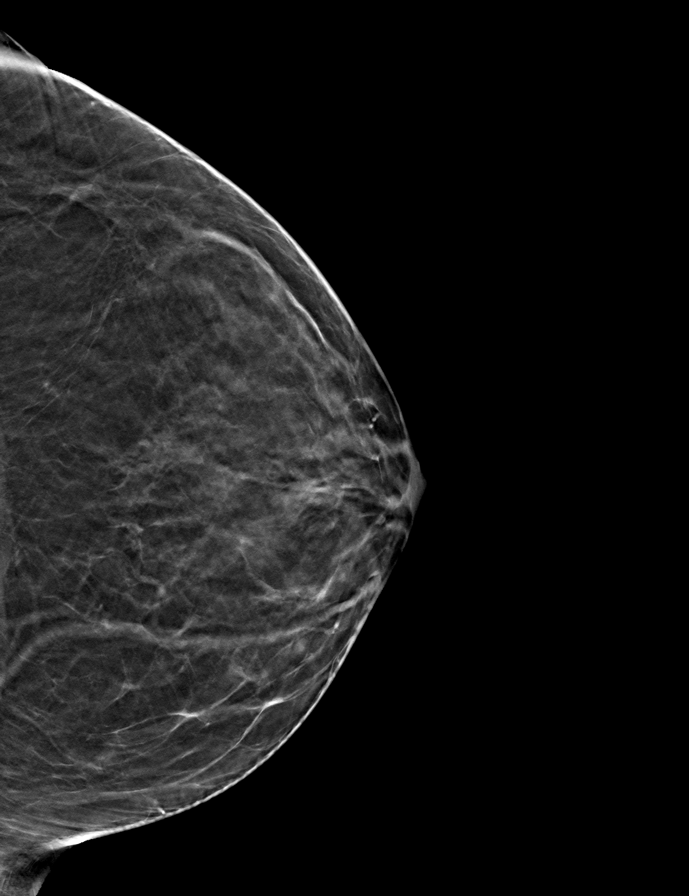

[R CC tomo · tomo slice 25/49.0]
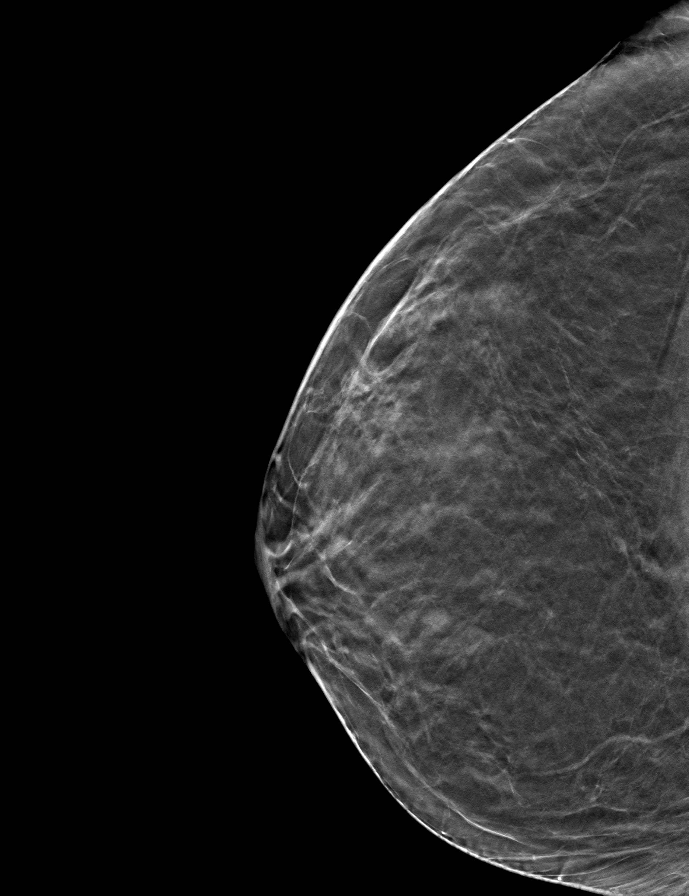

[9 of 24 positions shown; findings below may reference images not displayed]

ACR Breast Density Category b: There are scattered areas of
fibroglandular density.
FINDINGS: There are no findings suspicious for malignancy.
IMPRESSION: No mammographic evidence of malignancy. A result letter of this
screening mammogram will be mailed directly to the patient.

RECOMMENDATION:
Screening mammogram in one year. (Code:35-U-4J2)

BI-RADS CATEGORY  1: Negative.

## 2024-01-25 ENCOUNTER — Ambulatory Visit: Admitting: Adult Health

## 2024-01-25 ENCOUNTER — Encounter: Payer: Self-pay | Admitting: Adult Health

## 2024-01-25 ENCOUNTER — Other Ambulatory Visit (HOSPITAL_COMMUNITY)
Admission: RE | Admit: 2024-01-25 | Discharge: 2024-01-25 | Disposition: A | Discharge status: Home / Self Care | Source: Ambulatory Visit | Attending: Adult Health | Admitting: Adult Health

## 2024-01-25 VITALS — BP 107/69 | HR 71 | Ht 62.0 in | Wt 150.0 lb

## 2024-01-25 DIAGNOSIS — N764 Abscess of vulva: Secondary | ICD-10-CM | POA: Diagnosis not present

## 2024-01-25 DIAGNOSIS — Z01419 Encounter for gynecological examination (general) (routine) without abnormal findings: Secondary | ICD-10-CM | POA: Diagnosis present

## 2024-01-25 DIAGNOSIS — D239 Other benign neoplasm of skin, unspecified: Secondary | ICD-10-CM | POA: Diagnosis not present

## 2024-01-25 DIAGNOSIS — Z1331 Encounter for screening for depression: Secondary | ICD-10-CM | POA: Diagnosis not present

## 2024-01-25 DIAGNOSIS — Z1211 Encounter for screening for malignant neoplasm of colon: Secondary | ICD-10-CM | POA: Diagnosis not present

## 2024-01-25 DIAGNOSIS — Z01411 Encounter for gynecological examination (general) (routine) with abnormal findings: Secondary | ICD-10-CM | POA: Diagnosis not present

## 2024-01-25 LAB — HEMOCCULT GUIAC POC 1CARD (OFFICE): Fecal Occult Blood, POC: NEGATIVE

## 2024-01-25 MED ORDER — SULFAMETHOXAZOLE-TRIMETHOPRIM 800-160 MG PO TABS: 1.0000 | ORAL_TABLET | Freq: Two times a day (BID) | ORAL | 0 refills | Status: AC

## 2024-01-25 NOTE — Progress Notes (Signed)
 Patient ID: Jade Cook, female   DOB: 12/22/1980, 43 y.o.   MRN: [id number] History of Present Illness:  Jade Cook is a 43 year old white female, married, G0P0, in for a well woman gyn exam and pap. Has bump left vulva.  Has lost total of 120 lbs .  PCP is C Eure PA  Current Medications, Allergies, Past Medical History, Past Surgical History, Family History and Social History were reviewed in [employer] record.     Review of Systems: Patient denies any headaches, hearing loss, fatigue, blurred vision, shortness of breath, chest pain, abdominal pain, problems with bowel movements, urination, or intercourse.(Not currently active,husband has had 4 surgeries) No joint pain or mood swings.  +bump left vulva  Physical Exam:BP 107/69 (BP Location: Right Arm, Patient Position: Sitting, Cuff Size: Normal)   Pulse 71   Ht 5' 2 (1.575 m)   Wt 150 lb (68 kg)   LMP 01/16/2024   BMI 27.44 kg/m   General:  Well developed, well nourished, no acute distress Skin:  Warm and dry,tan Neck:  Midline trachea, normal thyroid, good ROM, no lymphadenopathy Lungs; Clear to auscultation bilaterally Breast:  No dominant palpable mass, retraction, or nipple discharge Cardiovascular: Regular rate and rhythm Abdomen:  Soft, non tender, no hepatosplenomegaly Pelvic:  External genitalia is normal in appearance, has several angiokeratomas left vulva and has small boil, vs hair bump left vulva.  The vagina is normal in appearance. Urethra has no lesions or masses. The cervix is smooth, pap with HR HPV genotyping performed. Uterus is felt to be normal size, shape, and contour.  No adnexal masses or tenderness noted.Bladder is non tender, no masses felt. Rectal: Good sphincter tone, no polyps, or hemorrhoids felt.  Hemoccult negative. Extremities/musculoskeletal:  No swelling or varicosities noted, no clubbing or cyanosis Psych:  No mood changes, alert and cooperative,seems happy AA is  1 Fall risk is low    01/25/2024   10:40 AM 01/06/2022    9:43 AM 12/23/2020    8:54 AM  Depression screen PHQ 2/9  Decreased Interest 0 1 0  Down, Depressed, Hopeless 0 0 0  PHQ - 2 Score 0 1 0  Altered sleeping 1 1 1   Tired, decreased energy 2 2 1   Change in appetite 1 1 1   Feeling bad or failure about yourself  0 0 0  Trouble concentrating 0 0 0  Moving slowly or fidgety/restless 0 1 0  Suicidal thoughts 0 0 0  PHQ-9 Score 4 6 3        01/25/2024   10:40 AM 01/06/2022    9:43 AM 12/23/2020    8:55 AM 12/31/2019    9:12 AM  GAD 7 : Generalized Anxiety Score  Nervous, Anxious, on Edge 1 1 1 2   Control/stop worrying 0 0 0 1  Worry too much - different things 2 1 0 2  Trouble relaxing 1 0 0 1  Restless 0 1 0 0  Easily annoyed or irritable 1 1 1 2   Afraid - awful might happen 0 0 0 0  Total GAD 7 Score 5 4 2 8   Anxiety Difficulty    Somewhat difficult    Upstream - 01/25/24 1046       Pregnancy Intention Screening   Does the patient want to become pregnant in the next year? No    Does the patient's partner want to become pregnant in the next year? No    Would the patient like to discuss contraceptive  options today? No      Contraception Wrap Up   Current Method Vasectomy;Abstinence    End Method Vasectomy    Contraception Counseling Provided No         Examination chaperoned by Clarita Salt LPN     Impression and Plan: 1. Encounter for gynecological examination with Papanicolaou smear of cervix (Primary) Pap sent Pap in 3 years if normal Physical in 1 year Labs with PCP Get mammogram   - Cytology - PAP( [place])  2. Encounter for screening fecal occult blood testing Hemoccult was negative  - POCT occult blood stool  3. Angiokeratoma Leave alone  4. Boil of vulva Use warm compress  Do no squeeze Will rx septra  ds  Meds ordered this encounter  Medications   sulfamethoxazole -trimethoprim  (BACTRIM  DS) 800-160 MG tablet    Sig: Take 1 tablet by mouth 2  (two) times daily. Take 1 bid    Dispense:  14 tablet    Refill:  0    Supervising Provider:   JAYNE MINDER H [[id number]]

## 2024-01-29 LAB — CYTOLOGY - PAP
Comment: NEGATIVE
Diagnosis: NEGATIVE
High risk HPV: NEGATIVE

## 2024-01-30 ENCOUNTER — Ambulatory Visit: Payer: Self-pay | Admitting: Adult Health
